# Patient Record
Sex: Female | Born: 2000
Health system: Southern US, Community
[De-identification: ages and names within clinical notes are randomized; demographics above are authoritative.]

## PROBLEM LIST (undated history)

## (undated) DIAGNOSIS — K589 Irritable bowel syndrome without diarrhea: Secondary | ICD-10-CM

## (undated) DIAGNOSIS — K760 Fatty (change of) liver, not elsewhere classified: Secondary | ICD-10-CM

## (undated) HISTORY — DX: Fatty (change of) liver, not elsewhere classified: K76.0

## (undated) HISTORY — DX: Irritable bowel syndrome without diarrhea: K58.9

---
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
## 2022-08-20 ENCOUNTER — Ambulatory Visit: Payer: Commercial Managed Care - PPO | Admitting: Family Medicine

## 2022-10-03 ENCOUNTER — Other Ambulatory Visit (HOSPITAL_COMMUNITY): Payer: Self-pay

## 2022-10-04 ENCOUNTER — Other Ambulatory Visit (HOSPITAL_COMMUNITY): Payer: Self-pay

## 2022-10-04 MED ORDER — RIZATRIPTAN BENZOATE 10 MG PO TABS
10.0000 mg | ORAL_TABLET | ORAL | 1 refills | Status: DC
  Filled 2022-10-04: qty 10, 30d supply, fill #0
  Filled 2022-11-18: qty 10, 30d supply, fill #1

## 2022-10-04 MED ORDER — NURTEC 75 MG PO TBDP
75.0000 mg | ORAL_TABLET | ORAL | 0 refills | Status: DC
  Filled 2022-10-04: qty 16, 30d supply, fill #0

## 2022-10-04 MED ORDER — LAMOTRIGINE 25 MG PO TABS
25.0000 mg | ORAL_TABLET | ORAL | 0 refills | Status: DC
  Filled 2022-10-04: qty 60, 30d supply, fill #0

## 2022-10-04 MED ORDER — VENLAFAXINE HCL ER 75 MG PO CP24
75.0000 mg | ORAL_CAPSULE | Freq: Every day | ORAL | 0 refills | Status: DC
  Filled 2022-10-04: qty 30, 30d supply, fill #0

## 2022-10-04 MED ORDER — TRAZODONE HCL 50 MG PO TABS
50.0000 mg | ORAL_TABLET | Freq: Every evening | ORAL | 0 refills | Status: DC | PRN
  Filled 2022-10-04: qty 30, 30d supply, fill #0

## 2022-10-10 ENCOUNTER — Other Ambulatory Visit (HOSPITAL_COMMUNITY): Payer: Self-pay

## 2022-10-28 ENCOUNTER — Other Ambulatory Visit (HOSPITAL_COMMUNITY): Payer: Self-pay

## 2022-10-28 DIAGNOSIS — F330 Major depressive disorder, recurrent, mild: Secondary | ICD-10-CM | POA: Diagnosis not present

## 2022-10-28 DIAGNOSIS — F411 Generalized anxiety disorder: Secondary | ICD-10-CM | POA: Diagnosis not present

## 2022-10-28 DIAGNOSIS — G4700 Insomnia, unspecified: Secondary | ICD-10-CM | POA: Diagnosis not present

## 2022-10-28 MED ORDER — LAMOTRIGINE 25 MG PO TABS
ORAL_TABLET | ORAL | 0 refills | Status: DC
  Filled 2022-10-28 – 2022-11-18 (×2): qty 180, 90d supply, fill #0

## 2022-10-28 MED ORDER — TRAZODONE HCL 50 MG PO TABS
50.0000 mg | ORAL_TABLET | Freq: Every evening | ORAL | 0 refills | Status: AC | PRN
  Filled 2022-10-28 – 2022-11-18 (×2): qty 90, 90d supply, fill #0

## 2022-10-28 MED ORDER — VENLAFAXINE HCL ER 75 MG PO CP24
75.0000 mg | ORAL_CAPSULE | Freq: Every day | ORAL | 0 refills | Status: DC
  Filled 2022-10-28 – 2022-11-18 (×2): qty 90, 90d supply, fill #0

## 2022-11-14 ENCOUNTER — Other Ambulatory Visit (HOSPITAL_COMMUNITY): Payer: Self-pay

## 2022-11-18 ENCOUNTER — Other Ambulatory Visit: Payer: Self-pay

## 2022-11-18 ENCOUNTER — Other Ambulatory Visit (HOSPITAL_COMMUNITY): Payer: Self-pay

## 2022-11-21 DIAGNOSIS — F411 Generalized anxiety disorder: Secondary | ICD-10-CM | POA: Diagnosis not present

## 2022-11-21 DIAGNOSIS — G4700 Insomnia, unspecified: Secondary | ICD-10-CM | POA: Diagnosis not present

## 2022-11-28 DIAGNOSIS — Z1339 Encounter for screening examination for other mental health and behavioral disorders: Secondary | ICD-10-CM | POA: Diagnosis not present

## 2022-11-28 DIAGNOSIS — F411 Generalized anxiety disorder: Secondary | ICD-10-CM | POA: Diagnosis not present

## 2022-11-28 DIAGNOSIS — F430 Acute stress reaction: Secondary | ICD-10-CM | POA: Diagnosis not present

## 2022-11-28 DIAGNOSIS — F330 Major depressive disorder, recurrent, mild: Secondary | ICD-10-CM | POA: Diagnosis not present

## 2022-12-10 DIAGNOSIS — G43711 Chronic migraine without aura, intractable, with status migrainosus: Secondary | ICD-10-CM | POA: Diagnosis not present

## 2022-12-17 ENCOUNTER — Other Ambulatory Visit (HOSPITAL_COMMUNITY): Payer: Self-pay

## 2022-12-19 ENCOUNTER — Other Ambulatory Visit (HOSPITAL_COMMUNITY): Payer: Self-pay

## 2022-12-31 DIAGNOSIS — Z6838 Body mass index (BMI) 38.0-38.9, adult: Secondary | ICD-10-CM | POA: Diagnosis not present

## 2022-12-31 DIAGNOSIS — E6609 Other obesity due to excess calories: Secondary | ICD-10-CM | POA: Diagnosis not present

## 2023-01-01 ENCOUNTER — Other Ambulatory Visit (HOSPITAL_COMMUNITY): Payer: Self-pay

## 2023-01-01 MED ORDER — PHENTERMINE HCL 37.5 MG PO TABS
37.5000 mg | ORAL_TABLET | Freq: Every day | ORAL | 1 refills | Status: DC
  Filled 2023-01-01 – 2023-01-27 (×2): qty 30, 30d supply, fill #0
  Filled 2023-03-16: qty 30, 30d supply, fill #1

## 2023-01-01 MED ORDER — RIZATRIPTAN BENZOATE 10 MG PO TABS
10.0000 mg | ORAL_TABLET | ORAL | 1 refills | Status: DC
  Filled 2023-01-01 – 2023-01-27 (×2): qty 10, 30d supply, fill #0
  Filled 2023-03-16: qty 10, 30d supply, fill #1

## 2023-01-02 ENCOUNTER — Ambulatory Visit
Admission: RE | Admit: 2023-01-02 | Discharge: 2023-01-02 | Disposition: A | Discharge status: Home / Self Care | Payer: Commercial Managed Care - PPO | Source: Ambulatory Visit

## 2023-01-02 ENCOUNTER — Other Ambulatory Visit (HOSPITAL_COMMUNITY): Payer: Self-pay

## 2023-01-02 ENCOUNTER — Ambulatory Visit (INDEPENDENT_AMBULATORY_CARE_PROVIDER_SITE_OTHER): Payer: Commercial Managed Care - PPO

## 2023-01-02 VITALS — BP 120/84 | HR 103 | Temp 98.4°F | Resp 18

## 2023-01-02 DIAGNOSIS — M25511 Pain in right shoulder: Secondary | ICD-10-CM | POA: Diagnosis not present

## 2023-01-02 MED ORDER — NAPROXEN 375 MG PO TABS
375.0000 mg | ORAL_TABLET | Freq: Two times a day (BID) | ORAL | 0 refills | Status: AC
  Filled 2023-01-02: qty 30, 15d supply, fill #0

## 2023-01-02 NOTE — ED Triage Notes (Signed)
Patient c/o right shoulder aching/ sore pain for a few months. Patient has full ROM to extremity.   Home interventions: rest, ice packs, tylenol

## 2023-01-02 NOTE — ED Provider Notes (Signed)
[facility]  Note:  This document was prepared using Systems analyst and may include unintentional dictation errors.  MRN: [id number] DOB: [birth year]  Subjective:   Molly Vega is a 22 y.o. female presenting for several month history of persistent intermittent progressively worsening right shoulder pain, aching.  No fall, trauma.  Patient has been trying to rest and use icing, Tylenol.  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  No fall, trauma, numbness or tingling, saddle paresthesia, changes to bowel or urinary habits, radicular symptoms.   No current facility-administered medications for this encounter.  Current Outpatient Medications:    hydrOXYzine (VISTARIL) 25 MG capsule, Take 25 mg by mouth 3 (three) times daily as needed., Disp: , Rfl:    levonorgestrel (KYLEENA) 19.5 MG IUD, by Intrauterine route., Disp: , Rfl:    Divalproex Sodium (DEPAKOTE PO), Take by mouth.  , Disp: , Rfl:    lamoTRIgine (LAMICTAL) 25 MG tablet, Take 1 tablet (25 mg total) by mouth daily for 7 days, THEN 2 tablets (50 mg total) daily., Disp: 180 tablet, Rfl: 0   METFORMIN HCL PO, Take by mouth.  , Disp: , Rfl:    phentermine (ADIPEX-P) 37.5 MG tablet, Take 1 tablet (37.5 mg total) by mouth daily., Disp: 30 tablet, Rfl: 1   Rimegepant Sulfate (NURTEC) 75 MG TBDP, Take 75 mg by mouth every other day for prevention of migraine. Do not repeat for 48 hours, Disp: 16 tablet, Rfl: 0   rizatriptan (MAXALT) 10 MG tablet, Take 1 tablet (10 mg total) by mouth as needed for migraine. May repeat in 2 hours if needed, Disp: 10 tablet, Rfl: 1   sertraline (ZOLOFT) 100 MG tablet, Take 100 mg by mouth daily., Disp: , Rfl:    traZODone (DESYREL) 50 MG tablet, Take 1 tablet (50 mg total) by mouth at bedtime as needed., Disp: 90 tablet, Rfl: 0   venlafaxine XR (EFFEXOR XR) 75 MG 24 hr capsule, Take 1 capsule (75 mg total) by mouth daily., Disp: 90  capsule, Rfl: 0   No Known Allergies  Past Medical History:  Diagnosis Date   Fatty liver    IBS (irritable bowel syndrome)      Past Surgical History:  Procedure Laterality Date   GASTRIC BYPASS  2018    Family History  Problem Relation Age of Onset   Cholelithiasis Mother    Seizures Sister    Migraines Sister    Asthma Sister    Polycystic ovary syndrome Sister    Cholelithiasis Maternal Aunt    Crohn's disease Maternal Aunt    Cholelithiasis Maternal Grandmother    Cholelithiasis Maternal Grandfather     Social History   Tobacco Use   Smokeless tobacco: Never  Vaping Use   Vaping Use: Never used  Substance Use Topics   Alcohol use: Never   Drug use: Never    ROS   Objective:   Vitals: BP 120/84 (BP Location: Left Arm)   Pulse (!) 103   Temp 98.4 F (36.9 C) (Oral)   Resp 18   SpO2 97%   Physical Exam Constitutional:      General: She is not in acute distress.    Appearance: Normal appearance. She is well-developed. She is not ill-appearing, toxic-appearing or diaphoretic.  HENT:     Head: Normocephalic and atraumatic.     Nose: Nose normal.     Mouth/Throat:     Mouth: Mucous  membranes are moist.  Eyes:     General: No scleral icterus.       Right eye: No discharge.        Left eye: No discharge.     Extraocular Movements: Extraocular movements intact.  Cardiovascular:     Rate and Rhythm: Normal rate.  Pulmonary:     Effort: Pulmonary effort is normal.  Musculoskeletal:     Right shoulder: Tenderness (across the deltoids, distal trapezius, +Hawkins test) and bony tenderness (AC joint) present. No swelling, deformity, effusion, laceration or crepitus. Normal range of motion. Normal strength.  Skin:    General: Skin is warm and dry.  Neurological:     General: No focal deficit present.     Mental Status: She is alert and oriented to person, place, and time.  Psychiatric:        Mood and Affect: Mood normal.        Behavior: Behavior  normal.     DG Shoulder Right  Result Date: 01/02/2023 CLINICAL DATA:  RIGHT shoulder pain, soreness and aching for a few months EXAM: RIGHT SHOULDER - 2+ VIEW COMPARISON:  None Available. FINDINGS: Osseous mineralization normal. AC joint alignment normal. Visualized ribs intact. No acute fracture, dislocation, or bone destruction. IMPRESSION: No osseous abnormalities. Electronically Signed   By: Lavonia Dana M.D.   On: 01/02/2023 08:58     Assessment and Plan :   PDMP not reviewed this encounter.  1. Pain in joint of right shoulder     Will manage conservatively for inflammatory shoulder pain likely related to the burden of lifting from work. Recommended naproxen, icing, follow up with ortho for PT, consideration of local injection. Counseled patient on potential for adverse effects with medications prescribed/recommended today, ER and return-to-clinic precautions discussed, patient verbalized understanding.    Jaynee Eagles, [place] 01/02/23 [id number]

## 2023-01-13 ENCOUNTER — Other Ambulatory Visit (HOSPITAL_COMMUNITY): Payer: Self-pay

## 2023-01-14 ENCOUNTER — Other Ambulatory Visit (HOSPITAL_COMMUNITY): Payer: Self-pay

## 2023-01-14 DIAGNOSIS — L503 Dermatographic urticaria: Secondary | ICD-10-CM | POA: Diagnosis not present

## 2023-01-14 DIAGNOSIS — L508 Other urticaria: Secondary | ICD-10-CM | POA: Diagnosis not present

## 2023-01-14 MED ORDER — TRIAMCINOLONE ACETONIDE 0.1 % EX CREA
1.0000 | TOPICAL_CREAM | Freq: Two times a day (BID) | CUTANEOUS | 0 refills | Status: AC
  Filled 2023-01-14: qty 80, 14d supply, fill #0

## 2023-01-17 ENCOUNTER — Ambulatory Visit: Payer: Commercial Managed Care - PPO | Admitting: Orthopedic Surgery

## 2023-01-21 ENCOUNTER — Other Ambulatory Visit (HOSPITAL_COMMUNITY): Payer: Self-pay

## 2023-01-21 DIAGNOSIS — F330 Major depressive disorder, recurrent, mild: Secondary | ICD-10-CM | POA: Diagnosis not present

## 2023-01-21 DIAGNOSIS — F411 Generalized anxiety disorder: Secondary | ICD-10-CM | POA: Diagnosis not present

## 2023-01-21 DIAGNOSIS — Z79899 Other long term (current) drug therapy: Secondary | ICD-10-CM | POA: Diagnosis not present

## 2023-01-21 DIAGNOSIS — G4700 Insomnia, unspecified: Secondary | ICD-10-CM | POA: Diagnosis not present

## 2023-01-21 MED ORDER — VENLAFAXINE HCL ER 75 MG PO CP24
75.0000 mg | ORAL_CAPSULE | Freq: Every day | ORAL | 0 refills | Status: AC
  Filled 2023-01-21 – 2023-03-16 (×2): qty 90, 90d supply, fill #0

## 2023-01-21 MED ORDER — HYDROXYZINE PAMOATE 25 MG PO CAPS
25.0000 mg | ORAL_CAPSULE | Freq: Three times a day (TID) | ORAL | 3 refills | Status: AC | PRN
  Filled 2023-01-21: qty 90, 30d supply, fill #0

## 2023-01-21 MED ORDER — LAMOTRIGINE 25 MG PO TABS
ORAL_TABLET | ORAL | 0 refills | Status: DC
  Filled 2023-01-21 – 2023-03-16 (×2): qty 180, 90d supply, fill #0

## 2023-01-22 DIAGNOSIS — G4700 Insomnia, unspecified: Secondary | ICD-10-CM | POA: Diagnosis not present

## 2023-01-22 DIAGNOSIS — Z79899 Other long term (current) drug therapy: Secondary | ICD-10-CM | POA: Diagnosis not present

## 2023-01-22 DIAGNOSIS — F330 Major depressive disorder, recurrent, mild: Secondary | ICD-10-CM | POA: Diagnosis not present

## 2023-01-22 DIAGNOSIS — F430 Acute stress reaction: Secondary | ICD-10-CM | POA: Diagnosis not present

## 2023-01-22 DIAGNOSIS — F411 Generalized anxiety disorder: Secondary | ICD-10-CM | POA: Diagnosis not present

## 2023-01-27 ENCOUNTER — Ambulatory Visit: Payer: Commercial Managed Care - PPO | Admitting: Sports Medicine

## 2023-01-27 ENCOUNTER — Encounter: Payer: Self-pay | Admitting: Sports Medicine

## 2023-01-27 ENCOUNTER — Other Ambulatory Visit (HOSPITAL_COMMUNITY): Payer: Self-pay

## 2023-01-27 ENCOUNTER — Other Ambulatory Visit: Payer: Self-pay

## 2023-01-27 DIAGNOSIS — G8929 Other chronic pain: Secondary | ICD-10-CM | POA: Diagnosis not present

## 2023-01-27 DIAGNOSIS — M25511 Pain in right shoulder: Secondary | ICD-10-CM | POA: Diagnosis not present

## 2023-01-27 MED ORDER — RIMEGEPANT SULFATE 75 MG PO TBDP
75.0000 mg | ORAL_TABLET | ORAL | 0 refills | Status: DC
  Filled 2023-01-27: qty 16, 32d supply, fill #0

## 2023-01-27 NOTE — Progress Notes (Signed)
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

## 2023-01-27 NOTE — Progress Notes (Signed)
Molly Vega - 22 y.o. female MRN [id number]  Date of birth: 05-17-2001  Office Visit Note: Visit Date: 01/27/2023 PCP: Aurelio Jew, MD (Inactive) Referred by: Madelyn Brunner, DO  Subjective: Chief Complaint  Patient presents with   Right Shoulder - Pain   HPI: Molly Vega is a very pleasant 22 y.o. female who presents today for acute on chronic right shoulder pain.  She has had intermittent right shoulder pain that has come and went over the past few years.  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  She is taking naproxen and icing.  Denies any instability about the shoulder.  She has never had any dislocations or subluxations.  No numbness or tingling.  Pain is over the posterior lateral aspect of the shoulder.  Pertinent ROS were reviewed with the patient and found to be negative unless otherwise specified above in HPI.   Assessment & Plan: Visit Diagnoses:  1. Chronic right shoulder pain    Plan: Discussed with Molly Vega the nature of her chronic intermittent right shoulder pain.  She does have somewhat of a shallow glenoid cavity, that I think is predisposing to some of her pain and possibly labral pathology.  She does not have any instability and has not had any episodes of subluxation or dislocation, so I think it is very reasonable to tighten the shoulder joint with rotator cuff and strengthening exercises.  Discussed home therapy versus PT, we did give her some shoulder strengthening and rotator cuff exercises today.  She may take her naproxen or over-the-counter anti-inflammatories for pain control as needed.  Discussed with her the only to truly evaluate the labrum would be to obtain an MRI (arthrogram) the shoulder, but given the intermittent nature of her pain and her  stability, I do not think that this is necessary.  She is agreeable.  She will work hard on staying consistent with her home shoulder strengthening exercises, and will follow-up with me as needed.  Follow-up: Return if symptoms worsen or fail to improve.   Meds & Orders: No orders of the defined types were placed in this encounter.  No orders of the defined types were placed in this encounter.    Procedures: No procedures performed      Clinical History: No specialty comments available.  She reports that she does not have a smoking history on file. She has never used smokeless tobacco. No results for input(s): "HGBA1C", "LABURIC" in the last 8760 hours.  Objective:    Physical Exam  Gen: Well-appearing, in no acute distress; non-toxic CV: Regular Rate. Well-perfused. Warm.  Resp: Breathing unlabored on room air; no wheezing. Psych: Fluid speech in conversation; appropriate affect; normal thought process Neuro: Sensation intact throughout. No gross coordination deficits.   Ortho Exam - Right shoulder: No bony TTP, no AC joint TTP.  There is full range of motion both active and passively.  Negative drop arm test.  There is some mild pain with Gerber liftoff.  Positive O'Brien's testing.  Negative apprehension test.  5/5 strength of the rotator cuff and strength testing in all directions.  Neurovascular intact distally.  Imaging:  *Independent review of the right shoulder x-ray, 3 views from 01/02/2023 shows no acute fracture or  bony abnormality.  There is somewhat of a shallow groove over the glenoid.  DG Shoulder Right CLINICAL DATA:  RIGHT shoulder pain, soreness and aching for a few months  EXAM: RIGHT SHOULDER - 2+ VIEW  COMPARISON:  None Available.  FINDINGS: Osseous mineralization normal.  AC joint alignment normal.  Visualized ribs intact.  No acute fracture, dislocation, or bone destruction.  IMPRESSION: No osseous abnormalities.  Electronically Signed   By:  Ulyses Southward M.D.   On: 01/02/2023 08:58    Past Medical/Family/Surgical/Social History: Medications & Allergies reviewed per EMR, new medications updated. Patient Active Problem List   Diagnosis Date Noted   Hepatomegaly 10/24/2011   IBS (irritable bowel syndrome)    Fatty liver    Past Medical History:  Diagnosis Date   Fatty liver    IBS (irritable bowel syndrome)    Family History  Problem Relation Age of Onset   Cholelithiasis Mother    Seizures Sister    Migraines Sister    Asthma Sister    Polycystic ovary syndrome Sister    Cholelithiasis Maternal Aunt    Crohn's disease Maternal Aunt    Cholelithiasis Maternal Grandmother    Cholelithiasis Maternal Grandfather    Past Surgical History:  Procedure Laterality Date   GASTRIC BYPASS  2018   Social History   Occupational History   Not on file  Tobacco Use   Smoking status: Not on file   Smokeless tobacco: Never  Vaping Use   Vaping Use: Never used  Substance and Sexual Activity   Alcohol use: Never   Drug use: Never   Sexual activity: Not on file

## 2023-01-28 DIAGNOSIS — F330 Major depressive disorder, recurrent, mild: Secondary | ICD-10-CM | POA: Diagnosis not present

## 2023-01-28 DIAGNOSIS — F411 Generalized anxiety disorder: Secondary | ICD-10-CM | POA: Diagnosis not present

## 2023-03-04 ENCOUNTER — Other Ambulatory Visit (HOSPITAL_COMMUNITY): Payer: Self-pay

## 2023-03-04 DIAGNOSIS — F411 Generalized anxiety disorder: Secondary | ICD-10-CM | POA: Diagnosis not present

## 2023-03-04 DIAGNOSIS — G4700 Insomnia, unspecified: Secondary | ICD-10-CM | POA: Diagnosis not present

## 2023-03-04 DIAGNOSIS — F330 Major depressive disorder, recurrent, mild: Secondary | ICD-10-CM | POA: Diagnosis not present

## 2023-03-04 MED ORDER — ESZOPICLONE 1 MG PO TABS
1.0000 mg | ORAL_TABLET | Freq: Every evening | ORAL | 2 refills | Status: AC | PRN
  Filled 2023-03-04 – 2023-04-15 (×4): qty 30, 30d supply, fill #0

## 2023-03-16 ENCOUNTER — Other Ambulatory Visit (HOSPITAL_COMMUNITY): Payer: Self-pay

## 2023-03-17 ENCOUNTER — Other Ambulatory Visit (HOSPITAL_COMMUNITY): Payer: Self-pay

## 2023-03-17 ENCOUNTER — Other Ambulatory Visit: Payer: Self-pay

## 2023-03-17 MED ORDER — RIMEGEPANT SULFATE 75 MG PO TBDP
75.0000 mg | ORAL_TABLET | ORAL | 0 refills | Status: DC
  Filled 2023-03-17: qty 16, 32d supply, fill #0

## 2023-03-19 DIAGNOSIS — G4719 Other hypersomnia: Secondary | ICD-10-CM | POA: Diagnosis not present

## 2023-03-19 DIAGNOSIS — G4700 Insomnia, unspecified: Secondary | ICD-10-CM | POA: Diagnosis not present

## 2023-03-20 DIAGNOSIS — G4719 Other hypersomnia: Secondary | ICD-10-CM | POA: Diagnosis not present

## 2023-03-20 DIAGNOSIS — G4700 Insomnia, unspecified: Secondary | ICD-10-CM | POA: Diagnosis not present

## 2023-04-02 ENCOUNTER — Other Ambulatory Visit (HOSPITAL_COMMUNITY): Payer: Self-pay

## 2023-04-15 ENCOUNTER — Other Ambulatory Visit (HOSPITAL_COMMUNITY): Payer: Self-pay

## 2023-04-15 ENCOUNTER — Other Ambulatory Visit: Payer: Self-pay

## 2023-04-16 ENCOUNTER — Other Ambulatory Visit (HOSPITAL_COMMUNITY): Payer: Self-pay

## 2023-04-22 ENCOUNTER — Other Ambulatory Visit (HOSPITAL_COMMUNITY): Payer: Self-pay

## 2023-04-22 MED ORDER — RIZATRIPTAN BENZOATE 10 MG PO TABS
10.0000 mg | ORAL_TABLET | ORAL | 1 refills | Status: DC
  Filled 2023-04-22: qty 13, 15d supply, fill #0

## 2023-04-28 ENCOUNTER — Other Ambulatory Visit (HOSPITAL_COMMUNITY): Payer: Self-pay

## 2023-04-28 DIAGNOSIS — F330 Major depressive disorder, recurrent, mild: Secondary | ICD-10-CM | POA: Diagnosis not present

## 2023-04-28 DIAGNOSIS — F411 Generalized anxiety disorder: Secondary | ICD-10-CM | POA: Diagnosis not present

## 2023-04-28 DIAGNOSIS — G4700 Insomnia, unspecified: Secondary | ICD-10-CM | POA: Diagnosis not present

## 2023-04-28 MED ORDER — LAMOTRIGINE 25 MG PO TABS
ORAL_TABLET | ORAL | 0 refills | Status: DC
  Filled 2023-04-28: qty 180, 90d supply, fill #0

## 2023-04-28 MED ORDER — PROPRANOLOL HCL 20 MG PO TABS
20.0000 mg | ORAL_TABLET | Freq: Two times a day (BID) | ORAL | 3 refills | Status: AC | PRN
Start: 2023-04-28 — End: ?

## 2023-04-28 MED ORDER — ESZOPICLONE 1 MG PO TABS
1.0000 mg | ORAL_TABLET | Freq: Every day | ORAL | 2 refills | Status: AC
Start: 2023-04-28 — End: ?
  Filled 2023-04-28 – 2023-06-24 (×2): qty 30, 30d supply, fill #0

## 2023-04-28 MED ORDER — PROPRANOLOL HCL 20 MG PO TABS
20.0000 mg | ORAL_TABLET | Freq: Two times a day (BID) | ORAL | 3 refills | Status: AC | PRN
  Filled 2023-04-28 – 2023-05-16 (×2): qty 60, 30d supply, fill #0

## 2023-04-28 MED ORDER — VENLAFAXINE HCL ER 150 MG PO CP24
150.0000 mg | ORAL_CAPSULE | Freq: Every day | ORAL | 0 refills | Status: AC
  Filled 2023-04-28 – 2023-05-16 (×2): qty 90, 90d supply, fill #0

## 2023-04-28 MED ORDER — LAMOTRIGINE 25 MG PO TABS
50.0000 mg | ORAL_TABLET | Freq: Every day | ORAL | 0 refills | Status: AC
Start: 2023-04-28 — End: ?
  Filled 2023-04-28: qty 180, 90d supply, fill #0

## 2023-04-28 MED ORDER — LAMOTRIGINE 25 MG PO TABS: ORAL_TABLET | ORAL | 0 refills | Status: DC

## 2023-04-30 ENCOUNTER — Other Ambulatory Visit (HOSPITAL_COMMUNITY): Payer: Self-pay

## 2023-05-01 ENCOUNTER — Other Ambulatory Visit (HOSPITAL_COMMUNITY): Payer: Self-pay

## 2023-05-08 ENCOUNTER — Other Ambulatory Visit (HOSPITAL_COMMUNITY): Payer: Self-pay

## 2023-05-09 DIAGNOSIS — F411 Generalized anxiety disorder: Secondary | ICD-10-CM | POA: Diagnosis not present

## 2023-05-16 ENCOUNTER — Other Ambulatory Visit (HOSPITAL_COMMUNITY): Payer: Self-pay

## 2023-06-10 DIAGNOSIS — G43719 Chronic migraine without aura, intractable, without status migrainosus: Secondary | ICD-10-CM | POA: Diagnosis not present

## 2023-06-17 ENCOUNTER — Other Ambulatory Visit (HOSPITAL_COMMUNITY): Payer: Self-pay

## 2023-06-24 ENCOUNTER — Other Ambulatory Visit (HOSPITAL_COMMUNITY): Payer: Self-pay

## 2023-06-30 ENCOUNTER — Other Ambulatory Visit (HOSPITAL_COMMUNITY): Payer: Self-pay

## 2023-06-30 DIAGNOSIS — G4700 Insomnia, unspecified: Secondary | ICD-10-CM | POA: Diagnosis not present

## 2023-06-30 DIAGNOSIS — F411 Generalized anxiety disorder: Secondary | ICD-10-CM | POA: Diagnosis not present

## 2023-06-30 DIAGNOSIS — F331 Major depressive disorder, recurrent, moderate: Secondary | ICD-10-CM | POA: Diagnosis not present

## 2023-06-30 MED ORDER — TRINTELLIX 10 MG PO TABS
10.0000 mg | ORAL_TABLET | Freq: Every day | ORAL | 0 refills | Status: AC
  Filled 2023-06-30: qty 30, 30d supply, fill #0

## 2023-07-01 ENCOUNTER — Other Ambulatory Visit (HOSPITAL_COMMUNITY): Payer: Self-pay

## 2023-07-03 ENCOUNTER — Other Ambulatory Visit (HOSPITAL_COMMUNITY): Payer: Self-pay

## 2023-08-08 ENCOUNTER — Other Ambulatory Visit (HOSPITAL_COMMUNITY): Payer: Self-pay

## 2023-08-08 DIAGNOSIS — F331 Major depressive disorder, recurrent, moderate: Secondary | ICD-10-CM | POA: Diagnosis not present

## 2023-08-08 DIAGNOSIS — G4700 Insomnia, unspecified: Secondary | ICD-10-CM | POA: Diagnosis not present

## 2023-08-08 DIAGNOSIS — F411 Generalized anxiety disorder: Secondary | ICD-10-CM | POA: Diagnosis not present

## 2023-08-08 MED ORDER — LAMOTRIGINE 25 MG PO TABS
50.0000 mg | ORAL_TABLET | Freq: Every day | ORAL | 0 refills | Status: AC
  Filled 2023-08-08: qty 180, 90d supply, fill #0

## 2023-08-08 MED ORDER — TRINTELLIX 20 MG PO TABS
20.0000 mg | ORAL_TABLET | Freq: Every day | ORAL | 3 refills | Status: AC
Start: 2023-08-08 — End: ?
  Filled 2023-08-08: qty 30, 30d supply, fill #0
  Filled 2023-10-10: qty 30, 30d supply, fill #1

## 2023-08-15 ENCOUNTER — Other Ambulatory Visit (HOSPITAL_COMMUNITY): Payer: Self-pay

## 2023-08-15 MED ORDER — ELETRIPTAN HYDROBROMIDE 40 MG PO TABS
ORAL_TABLET | ORAL | 5 refills | Status: AC
  Filled 2023-08-15: qty 12, 30d supply, fill #0

## 2023-08-15 MED ORDER — QULIPTA 60 MG PO TABS
60.0000 mg | ORAL_TABLET | Freq: Every day | ORAL | 5 refills | Status: AC
  Filled 2023-08-15: qty 30, 30d supply, fill #0
  Filled 2023-10-10: qty 30, 30d supply, fill #1

## 2023-09-18 ENCOUNTER — Other Ambulatory Visit (HOSPITAL_COMMUNITY): Payer: Self-pay

## 2023-10-10 ENCOUNTER — Other Ambulatory Visit (HOSPITAL_COMMUNITY): Payer: Self-pay

## 2023-10-10 ENCOUNTER — Other Ambulatory Visit: Payer: Self-pay

## 2023-10-10 MED ORDER — ESZOPICLONE 1 MG PO TABS
1.0000 mg | ORAL_TABLET | Freq: Every evening | ORAL | 2 refills | Status: AC | PRN
  Filled 2023-10-10: qty 30, 30d supply, fill #0

## 2023-10-23 ENCOUNTER — Other Ambulatory Visit (HOSPITAL_COMMUNITY): Payer: Self-pay

## 2023-10-23 MED ORDER — LAMOTRIGINE 100 MG PO TABS
100.0000 mg | ORAL_TABLET | Freq: Every day | ORAL | 0 refills | Status: AC
  Filled 2023-10-23: qty 90, 90d supply, fill #0

## 2023-10-23 MED ORDER — RAMELTEON 8 MG PO TABS
8.0000 mg | ORAL_TABLET | Freq: Every evening | ORAL | 0 refills | Status: AC | PRN
  Filled 2023-10-23: qty 30, 30d supply, fill #0

## 2023-10-28 ENCOUNTER — Other Ambulatory Visit (HOSPITAL_COMMUNITY): Payer: Self-pay

## 2023-11-05 ENCOUNTER — Other Ambulatory Visit (HOSPITAL_COMMUNITY): Payer: Self-pay

## 2023-11-05 MED ORDER — NURTEC 75 MG PO TBDP
75.0000 mg | ORAL_TABLET | Freq: Every day | ORAL | 11 refills | Status: DC
  Filled 2023-11-05: qty 8, 30d supply, fill #0

## 2023-11-05 MED ORDER — MELOXICAM 15 MG PO TABS
15.0000 mg | ORAL_TABLET | Freq: Every day | ORAL | 1 refills | Status: DC
  Filled 2023-11-05: qty 30, 30d supply, fill #0

## 2023-11-05 MED ORDER — ZOLMITRIPTAN 5 MG NA SOLN
5.0000 mg | NASAL | 5 refills | Status: DC | PRN
  Filled 2023-11-05: qty 12, 30d supply, fill #0

## 2023-12-23 ENCOUNTER — Other Ambulatory Visit (HOSPITAL_COMMUNITY): Payer: Self-pay

## 2024-05-11 ENCOUNTER — Other Ambulatory Visit (HOSPITAL_BASED_OUTPATIENT_CLINIC_OR_DEPARTMENT_OTHER): Payer: Self-pay
